# Patient Record
Sex: Female | Born: 1990 | Race: Black or African American | Hispanic: No | Marital: Single | Smoking: Never smoker
Health system: Southern US, Community
[De-identification: ages and names within clinical notes are randomized; demographics above are authoritative.]

## PROBLEM LIST (undated history)

## (undated) DIAGNOSIS — M4800 Spinal stenosis, site unspecified: Secondary | ICD-10-CM

## (undated) DIAGNOSIS — M5136 Other intervertebral disc degeneration, lumbar region: Secondary | ICD-10-CM

## (undated) HISTORY — PX: DENTAL SURGERY: SHX609

---

## 2016-12-09 ENCOUNTER — Encounter (HOSPITAL_COMMUNITY): Payer: Self-pay | Admitting: Emergency Medicine

## 2016-12-09 ENCOUNTER — Emergency Department (HOSPITAL_COMMUNITY)
Admission: EM | Admit: 2016-12-09 | Discharge: 2016-12-09 | Disposition: A | Discharge status: Other Acute Inpatient Hospital | Payer: Medicaid Other | Attending: Emergency Medicine | Admitting: Emergency Medicine

## 2016-12-09 ENCOUNTER — Other Ambulatory Visit: Payer: Self-pay

## 2016-12-09 DIAGNOSIS — R102 Pelvic and perineal pain: Secondary | ICD-10-CM

## 2016-12-09 DIAGNOSIS — O4700 False labor before 37 completed weeks of gestation, unspecified trimester: Secondary | ICD-10-CM

## 2016-12-09 DIAGNOSIS — O479 False labor, unspecified: Secondary | ICD-10-CM | POA: Diagnosis not present

## 2016-12-09 DIAGNOSIS — Z91040 Latex allergy status: Secondary | ICD-10-CM | POA: Diagnosis not present

## 2016-12-09 DIAGNOSIS — Z79899 Other long term (current) drug therapy: Secondary | ICD-10-CM | POA: Insufficient documentation

## 2016-12-09 DIAGNOSIS — Z3A28 28 weeks gestation of pregnancy: Secondary | ICD-10-CM | POA: Diagnosis not present

## 2016-12-09 DIAGNOSIS — O9989 Other specified diseases and conditions complicating pregnancy, childbirth and the puerperium: Secondary | ICD-10-CM | POA: Diagnosis not present

## 2016-12-09 HISTORY — DX: Spinal stenosis, site unspecified: M48.00

## 2016-12-09 HISTORY — DX: Other intervertebral disc degeneration, lumbar region: M51.36

## 2016-12-09 LAB — URINALYSIS, ROUTINE W REFLEX MICROSCOPIC
BILIRUBIN URINE: NEGATIVE
GLUCOSE, UA: NEGATIVE mg/dL
Hgb urine dipstick: NEGATIVE
KETONES UR: NEGATIVE mg/dL
Leukocytes, UA: NEGATIVE
Nitrite: NEGATIVE
PH: 5 (ref 5.0–8.0)
Protein, ur: NEGATIVE mg/dL
Specific Gravity, Urine: 1.021 (ref 1.005–1.030)

## 2016-12-09 MED ORDER — SODIUM CHLORIDE 0.9 % IV BOLUS (SEPSIS)
1000.0000 mL | Freq: Once | INTRAVENOUS | Status: AC
  Administered 2016-12-09: 1000 mL via INTRAVENOUS

## 2016-12-09 NOTE — Progress Notes (Signed)
[facility] for [facility] care.

## 2016-12-09 NOTE — ED Provider Notes (Signed)
[facility] EMERGENCY DEPARTMENT Provider Note   CSN: [id number] Arrival date & time: 12/09/16  1505     History   Chief Complaint Chief Complaint  Patient presents with  . Abdominal Pain    [redacted] weeks pregnant    HPI Delice BisonSharon Fogarty is a 26 y.o. female.  HPI  Pt was seen at 1635. Per pt, c/o gradual onset and persistence of multiple intermittent episodes of "contractions" that began 2 to 3 days ago. Has been associated with acute flair of her chronic LBP as well as "pressure" in her pelvis. Pt states she was evaluated by her [facility]N [facility] and was told her cervix was "fingertip." Pt states she has been having her female significant other "check her cervix" and "he said he could get 2 fingers in my cervix now." Pt denies vaginal bleeding, no dysuria/hematuria, no CP/SOB. Hx G4P3, EDD 03/03/17 with EGA [redacted] weeks.    Past Medical History:  Diagnosis Date  . Degeneration of L4-L5 intervertebral disc   . Spinal stenosis     There are no active problems to display for this patient.   Past Surgical History:  Procedure Laterality Date  . CESAREAN SECTION    . DENTAL SURGERY      OB History    Gravida Para Term Preterm AB Living   5         3   SAB TAB Ectopic Multiple Live Births                   Home Medications    Prior to Admission medications   Medication Sig Start Date End Date Taking? Authorizing Provider  acetaminophen (TYLENOL) 500 MG tablet Take 1,000 mg by mouth every 6 (six) hours as needed for mild pain or moderate pain.   Yes [provider]  albuterol (PROVENTIL HFA;VENTOLIN HFA) 108 (90 Base) MCG/ACT inhaler Inhale 1-2 puffs into the lungs every 6 (six) hours as needed for wheezing or shortness of breath.   Yes [provider]  fluticasone (FLONASE) 50 MCG/ACT nasal spray Place 2 sprays into both nostrils daily as needed for allergies or rhinitis.   Yes [provider]    Family History History reviewed. No  pertinent family history.  Social History Social History   Tobacco Use  . Smoking status: Never Smoker  . Smokeless tobacco: Never Used  Substance Use Topics  . Alcohol use: No    Frequency: Never  . Drug use: No     Allergies   Latex   Review of Systems Review of Systems ROS: Statement: All systems negative except as marked or noted in the HPI; Constitutional: Negative for fever and chills. ; ; Eyes: Negative for eye pain, redness and discharge. ; ; ENMT: Negative for ear pain, hoarseness, nasal congestion, sinus pressure and sore throat. ; ; Cardiovascular: Negative for chest pain, palpitations, diaphoresis, dyspnea and peripheral edema. ; ; Respiratory: Negative for cough, wheezing and stridor. ; ; Gastrointestinal: Negative for nausea, vomiting, diarrhea, abdominal pain, blood in stool, hematemesis, jaundice and rectal bleeding. . ; ; Genitourinary: Negative for dysuria, flank pain and hematuria. ; ; GYN:  +contractions, +pelvic pain, no vaginal bleeding, no vaginal discharge, no vulvar pain. ;; Musculoskeletal: +LBP. Negative for neck pain. Negative for swelling and trauma.; ; Skin: Negative for pruritus, rash, abrasions, blisters, bruising and skin lesion.; ; Neuro: Negative for headache, lightheadedness and neck stiffness. Negative for weakness, altered level of consciousness, altered mental status,  extremity weakness, paresthesias, involuntary movement, seizure and syncope.       Physical Exam Updated Vital Signs BP 114/65   Pulse 75   Temp 98.4 F (36.9 C) (Oral)   Resp 16   Ht 5\' 4"  (1.626 m)   Wt 69.9 kg (154 lb)   SpO2 100%   BMI 26.43 kg/m   Physical Exam 1640: Physical examination:  Nursing notes reviewed; Vital signs and O2 SAT reviewed;  Constitutional: Well developed, Well nourished, Well hydrated, In no acute distress; Head:  Normocephalic, atraumatic; Eyes: EOMI, PERRL, No scleral icterus; ENMT: Mouth and pharynx normal, Mucous membranes moist; Neck:  Supple, Full range of motion, No lymphadenopathy; Cardiovascular: Regular rate and rhythm, No gallop; Respiratory: Breath sounds clear & equal bilaterally, No wheezes.  Speaking full sentences with ease, Normal respiratory effort/excursion; Chest: Nontender, Movement normal; Abdomen: Soft, Nontender, Nondistended, Normal bowel sounds. Gravid.; Genitourinary: No CVA tenderness. FHR 150's. SVE performed with female ED RN chaperone present: no vaginal bleeding, cervix 1 finger.;; Extremities: Pulses normal, No tenderness, No edema, No calf edema or asymmetry.; Neuro: AA&Ox3, Major CN grossly intact.  Speech clear. No gross focal motor or sensory deficits in extremities.; Skin: Color normal, Warm, Dry.   ED Treatments / Results  Labs (all labs ordered are listed, but only abnormal results are displayed) Labs Reviewed  URINALYSIS, ROUTINE W REFLEX MICROSCOPIC    EKG  EKG Interpretation None       Radiology   Procedures Procedures (including critical care time)  Medications Ordered in ED Medications  sodium chloride 0.9 % bolus 1,000 mL (1,000 mLs Intravenous New Bag/Given 12/09/16 1726)  sodium chloride 0.9 % bolus 1,000 mL (1,000 mLs Intravenous New Bag/Given 12/09/16 1726)     Initial Impression / Assessment and Plan / ED Course  I have reviewed the triage vital signs and the nursing notes.  Pertinent labs & imaging results that were available during my care of the patient were reviewed by me and considered in my medical decision making (see chart for details).  MDM Reviewed: previous chart, nursing note and vitals Interpretation: labs   Results for orders placed or performed during the hospital encounter of 12/09/16  Urinalysis, Routine w reflex microscopic  Result Value Ref Range   Color, Urine YELLOW YELLOW   APPearance CLEAR CLEAR   Specific Gravity, Urine 1.021 1.005 - 1.030   pH 5.0 5.0 - 8.0   Glucose, UA NEGATIVE NEGATIVE mg/dL   Hgb urine dipstick NEGATIVE NEGATIVE    Bilirubin Urine NEGATIVE NEGATIVE   Ketones, ur NEGATIVE NEGATIVE mg/dL   Protein, ur NEGATIVE NEGATIVE mg/dL   Nitrite NEGATIVE NEGATIVE   Leukocytes, UA NEGATIVE NEGATIVE    1705:  Pt states she is "having contractions" since arrival to the ED. FHR 150's. No vaginal bleeding. Concern regarding HPI above with female significant other checking cervix and possible infection.  [facility] Dr. Vonda Antiguaeiff, case discussed, including:  HPI, pertinent PM/SHx, VS/PE, dx testing, ED course and treatment:  Agreeable to accept transfer to 5700.      Final Clinical Impressions(s) / ED Diagnoses   Final diagnoses:  None    ED Discharge Orders    None       Samuel JesterMcManus, Latha Staunton, DO 12/12/16 [id number]

## 2016-12-09 NOTE — ED Notes (Signed)
Carollee HerterShannon, OB Rapid Response RN notified at this time.

## 2016-12-09 NOTE — Progress Notes (Addendum)
G4P3 at 28 weeks reports to [facility] with c/o cxns x2 days.  No bleeding or leaking reported to [facility].  [facility] for high risk pregnancy due to hx: spinal stenosis.  Talked with Aundra MilletMegan RN at [facility].  [facility] MD performed SVE and states she is FT.

## 2016-12-09 NOTE — ED Triage Notes (Signed)
PT states she is [redacted] weeks pregnant and started having some painful contractions x2 days ago. PT states her OBGYN told her to get checked out to make sure she is not dilated.

## 2016-12-09 NOTE — Progress Notes (Signed)
Discussed pt status with Dr Jolayne Pantheronstant.  [facility] not [facility] patients due to high census and acuity.   [facility] to determine future [facility].  [facility] states the pt has already contacted [facility] about her current complaints and status.  They have accepted her care if she decides to leave [facility].

## 2017-06-27 ENCOUNTER — Other Ambulatory Visit: Payer: Self-pay

## 2017-06-27 ENCOUNTER — Encounter (HOSPITAL_COMMUNITY): Payer: Self-pay | Admitting: *Deleted

## 2017-06-27 ENCOUNTER — Emergency Department (HOSPITAL_COMMUNITY)
Admission: EM | Admit: 2017-06-27 | Discharge: 2017-06-28 | Disposition: A | Discharge status: Home / Self Care | Payer: Medicaid Other | Attending: Emergency Medicine | Admitting: Emergency Medicine

## 2017-06-27 DIAGNOSIS — R079 Chest pain, unspecified: Secondary | ICD-10-CM | POA: Diagnosis present

## 2017-06-27 DIAGNOSIS — M791 Myalgia, unspecified site: Secondary | ICD-10-CM | POA: Diagnosis not present

## 2017-06-27 DIAGNOSIS — J029 Acute pharyngitis, unspecified: Secondary | ICD-10-CM | POA: Insufficient documentation

## 2017-06-27 NOTE — ED Notes (Signed)
[facility]

## 2017-06-27 NOTE — ED Triage Notes (Signed)
Pt c/o sore throat, throat swelling, unable to speak, chest pain that started tonight,

## 2017-06-28 ENCOUNTER — Emergency Department (HOSPITAL_COMMUNITY): Payer: Medicaid Other

## 2017-06-28 LAB — GROUP A STREP BY PCR: Group A Strep by PCR: NOT DETECTED

## 2017-06-28 MED ORDER — ONDANSETRON 4 MG PO TBDP
4.0000 mg | ORAL_TABLET | Freq: Once | ORAL | Status: AC
  Administered 2017-06-28: 4 mg via ORAL
  Filled 2017-06-28: qty 1

## 2017-06-28 MED ORDER — HYDROMORPHONE HCL 1 MG/ML IJ SOLN
0.5000 mg | Freq: Once | INTRAMUSCULAR | Status: AC
  Administered 2017-06-28: 0.5 mg via INTRAMUSCULAR
  Filled 2017-06-28: qty 1

## 2017-06-28 NOTE — Discharge Instructions (Addendum)
Your vital signs are within normal limits.  Your strep test is negative.  Your CT scan shows no mass, or abscess or related problems within the neck, the sinuses, or the voicebox area.  I suspect that you have a viral pharyngitis/sore throat.  Please wash hands frequently.  Please use Tylenol every 4 hours, or ibuprofen every 6 hours.  Salt water gargles will be helpful.  Chloraseptic spray will also be helpful.  Please do not allow anyone to share your eating utensils.  Keep your distance from others until this has resolved.  Please see your Medicaid access physician or return to the emergency department if any changes in your condition, problems, or concerns.

## 2017-06-28 NOTE — ED Provider Notes (Signed)
[facility] EMERGENCY DEPARTMENT Provider Note   CSN: [id number] Arrival date & time: 06/27/17  2325     History   Chief Complaint Chief Complaint  Patient presents with  . Chest Pain    HPI Kathleen Lecy is a 27 y.o. female.  Patient is a 27 year old female who presents to the emergency department with a complaint of sore throat, loss of voice, chest pain, and back pain.  The patient states that it hurts too much to swallow and it hurts too much to speak  .  The patient history is obtained through the spouse, and the patient is communicating with the spouse at times through the cell phone text.  This problem started on yesterday.  The patient now has throat pain, sensation that she has swelling in her throat.  She states that she is unable to speak because it hurts too bad.  She also says that she has pain from the throat area into the chest and then into the back.  She has not had any high fever to be reported.  She has not had any injury or trauma to the throat area.  No trauma to the chest area.  She has not had any recent operations or procedures.  She has not had any loss of consciousness or unusual shortness of breath.  She presents to the emergency department because she cannot take the pain anymore.  The history is provided by the spouse.  Chest Pain   Pertinent negatives include no abdominal pain, no back pain, no cough, no dizziness, no fever, no palpitations and no shortness of breath.  Pertinent negatives for past medical history include no seizures.    Past Medical History:  Diagnosis Date  . Degeneration of L4-L5 intervertebral disc   . Spinal stenosis     There are no active problems to display for this patient.   Past Surgical History:  Procedure Laterality Date  . CESAREAN SECTION    . DENTAL SURGERY       OB History    Gravida  5   Para      Term      Preterm      AB      Living  3     SAB      TAB      Ectopic      Multiple      Live Births               Home Medications    Prior to Admission medications   Medication Sig Start Date End Date Taking? Authorizing Provider  acetaminophen (TYLENOL) 500 MG tablet Take 1,000 mg by mouth every 6 (six) hours as needed for mild pain or moderate pain.    [provider]  albuterol (PROVENTIL HFA;VENTOLIN HFA) 108 (90 Base) MCG/ACT inhaler Inhale 1-2 puffs into the lungs every 6 (six) hours as needed for wheezing or shortness of breath.    [provider]  fluticasone (FLONASE) 50 MCG/ACT nasal spray Place 2 sprays into both nostrils daily as needed for allergies or rhinitis.    [provider]    Family History No family history on file.  Social History Social History   Tobacco Use  . Smoking status: Never Smoker  . Smokeless tobacco: Never Used  Substance Use Topics  . Alcohol use: No    Frequency: Never  . Drug use: No     Allergies   Latex   Review of Systems Review  of Systems  Constitutional: Positive for activity change and appetite change. Negative for fever.       All ROS Neg except as noted in HPI  HENT: Positive for sore throat, trouble swallowing and voice change. Negative for nosebleeds.   Eyes: Negative for photophobia and discharge.  Respiratory: Negative for cough, shortness of breath and wheezing.   Cardiovascular: Positive for chest pain. Negative for palpitations.  Gastrointestinal: Negative for abdominal pain and blood in stool.  Genitourinary: Negative for dysuria, frequency and hematuria.  Musculoskeletal: Negative for arthralgias, back pain and neck pain.  Skin: Negative.   Neurological: Negative for dizziness, seizures and speech difficulty.  Psychiatric/Behavioral: Negative for confusion and hallucinations.     Physical Exam Updated Vital Signs BP 132/79   Pulse (!) 56   Temp 98.2 F (36.8 C) (Oral)   Resp 19   Ht 5\' 4"  (1.626 m)   Wt 74.8 kg (165 lb)   SpO2 100%   Breastfeeding? Unknown    BMI 28.32 kg/m   Physical Exam  Constitutional: She is oriented to person, place, and time. She appears well-developed and well-nourished.  Non-toxic appearance.  HENT:  Head: Normocephalic.  Right Ear: Tympanic membrane and external ear normal.  Left Ear: Tympanic membrane and external ear normal.  Few palpable small cervical lymph nodes appreciated. Mild increased redness of the posterior pharynx.  The uvula is in the midline.  The airway is patent.  No exudates seen.  Eyes: Pupils are equal, round, and reactive to light. EOM and lids are normal.  Neck: Normal range of motion. Neck supple. Carotid bruit is not present. No tracheal deviation present.  Cardiovascular: Normal rate, regular rhythm, normal heart sounds, intact distal pulses and normal pulses. Exam reveals no gallop and no friction rub.  No murmur heard. Pulmonary/Chest: Effort normal and breath sounds normal. No respiratory distress. She has no wheezes.  Abdominal: Soft. Bowel sounds are normal. There is no tenderness. There is no guarding.  Musculoskeletal: Normal range of motion.  Lymphadenopathy:       Head (right side): No submandibular adenopathy present.       Head (left side): No submandibular adenopathy present.    She has cervical adenopathy.  Neurological: She is alert and oriented to person, place, and time. She has normal strength. No cranial nerve deficit or sensory deficit. Coordination normal.  Skin: Skin is warm and dry. No rash noted.  Psychiatric: She has a normal mood and affect. Her speech is normal.  Nursing note and vitals reviewed.    ED Treatments / Results  Labs (all labs ordered are listed, but only abnormal results are displayed) Labs Reviewed  GROUP A STREP BY PCR    EKG EKG Interpretation  Date/Time:  Saturday June 27 2017 23:49:35 EDT Ventricular Rate:  67 PR Interval:    QRS Duration: 96 QT Interval:  439 QTC Calculation: 464 R Axis:   51 Text Interpretation:  Sinus rhythm  Low voltage, precordial leads Borderline T abnormalities, diffuse leads Baseline wander in lead(s) II No old tracing to compare Confirmed by Devoria Albe ([id number]) on 06/28/2017 12:09:39 AM   Radiology Ct Soft Tissue Neck Wo Contrast  Result Date: 06/28/2017 CLINICAL DATA:  Initial evaluation for acute sore throat, throat swelling. EXAM: CT NECK WITHOUT CONTRAST TECHNIQUE: Multidetector CT imaging of the neck was performed following the standard protocol without intravenous contrast. COMPARISON:  None. FINDINGS: Pharynx and larynx: Oral cavity within normal limits without mass lesion or loculated collection. No acute inflammatory  changes about the dentition. Oropharynx and nasopharynx within normal limits. No retropharyngeal collection or effusion. Epiglottis within normal limits. Vallecula largely clear. Remainder of the hypopharynx and supraglottic larynx within normal limits. True cords grossly symmetric and within normal limits, although evaluation limited by motion. Subglottic airway clear. Salivary glands: Salivary glands including the parotid and submandibular glands are normal. Thyroid: Thyroid within normal limits. Lymph nodes: Shotty subcentimeter lymph nodes seen throughout the neck bilaterally. No pathologically enlarged lymph nodes identified. Vascular: No significant atherosclerotic change within the neck. Limited intracranial: Unremarkable. Visualized orbits: Visualized globes and orbital soft tissues within normal limits. Mastoids and visualized paranasal sinuses: Minimal mucosal thickening within the right posterior ethmoidal air cells. Visualized paranasal sinuses are otherwise clear. Mastoid air cells and middle ear cavities are well pneumatized and free of fluid. Skeleton: No acute osseous abnormality. No worrisome lytic or blastic osseous lesions. Upper chest: Partially visualized upper chest within normal limits. Partially visualized lungs are grossly clear. Other: None. IMPRESSION: Negative  noncontrast CT of the neck. No acute inflammatory changes or other abnormality identified. Oropharyngeal airway widely patent. Electronically Signed   By: Rise Mu M.D.   On: 06/28/2017 01:29    Procedures Procedures (including critical care time)  Medications Ordered in ED Medications  HYDROmorphone (DILAUDID) injection 0.5 mg (0.5 mg Intramuscular Given 06/28/17 0042)  ondansetron (ZOFRAN-ODT) disintegrating tablet 4 mg (4 mg Oral Given 06/28/17 0041)     Initial Impression / Assessment and Plan / ED Course  I have reviewed the triage vital signs and the nursing notes.  Pertinent labs & imaging results that were available during my care of the patient were reviewed by me and considered in my medical decision making (see chart for details).       Final Clinical Impressions(s) / ED Diagnoses MDM  Vital signs within normal limits.  Patient states the pain is severe to swallow and she cannot speak because it hurts.  No stridor appreciated on auscultation over the tracheal area and the trachea is midline.  Patient given intramuscular Dilaudid and Zofran ODT.  Strep test is negative, vital signs are within normal limits, doubt strep related infection.  Electrocardiogram shows a normal sinus rhythm.  There is no STEMI, And no life-threatening arrhythmias.   CT scan of the soft tissues of the neck show the pharynx, larynx, oral cavity all within normal limits.  There is no mass appreciated.  The epiglottis is within normal limits, doubt epiglottitis or related problem.  There are few shotty lymph nodes seen throughout the neck bilaterally.  No worrisome or lytic lesions noted within the bony structures.  In the partially visualized upper chest is within normal limits.  Chest x-ray-negative for active cardiopulmonary disease.  Recheck.  The patient is beginning to speak a little bit now she says that the shot seems to be helping her pain and discomfort.  I have discussed the  CT scan, the strips test, and the chest x-ray with the patient and her spouse in terms of which they understand.  Questions were answered.  I have asked the patient to use salt water gargles and Chloraseptic spray for pain and discomfort.  To use Tylenol every 4 hours, or ibuprofen every 6 hours.  To wash hands frequently.  I provided a mask for them.  I have asked him to follow-up with a primary physician.  Or to return to the emergency department if any changes in condition, problems, or concerns.  Family is in agreement with this plan.  Final diagnoses:  Pharyngitis, unspecified etiology  Myalgia    ED Discharge Orders    None       Ivery QualeBryant, Toye Rouillard, PA-C 06/28/17 1649    Devoria AlbeKnapp, Iva, MD 06/28/17 2258

## 2017-11-03 ENCOUNTER — Encounter (HOSPITAL_COMMUNITY): Payer: Self-pay

## 2017-11-03 ENCOUNTER — Emergency Department (HOSPITAL_COMMUNITY): Payer: Medicaid Other

## 2017-11-03 ENCOUNTER — Other Ambulatory Visit: Payer: Self-pay

## 2017-11-03 ENCOUNTER — Emergency Department (HOSPITAL_COMMUNITY)
Admission: EM | Admit: 2017-11-03 | Discharge: 2017-11-03 | Disposition: A | Discharge status: Home / Self Care | Payer: Medicaid Other | Attending: Emergency Medicine | Admitting: Emergency Medicine

## 2017-11-03 DIAGNOSIS — J40 Bronchitis, not specified as acute or chronic: Secondary | ICD-10-CM | POA: Insufficient documentation

## 2017-11-03 DIAGNOSIS — R05 Cough: Secondary | ICD-10-CM | POA: Diagnosis present

## 2017-11-03 LAB — INFLUENZA PANEL BY PCR (TYPE A & B)
INFLBPCR: NEGATIVE
Influenza A By PCR: NEGATIVE

## 2017-11-03 MED ORDER — AMOXICILLIN 500 MG PO CAPS: 500.0000 mg | ORAL_CAPSULE | Freq: Three times a day (TID) | ORAL | 0 refills | Status: AC

## 2017-11-03 NOTE — ED Provider Notes (Signed)
[facility] EMERGENCY DEPARTMENT Provider Note   CSN: [id number] Arrival date & time: 11/03/17  4034     History   Chief Complaint Chief Complaint  Patient presents with  . Cough    HPI Kathleen Frank is a 27 y.o. female.  Patient complains of cough and congestion and aches for a week now.  Minimal sputum production  The history is provided by the patient. No language interpreter was used.  Cough  This is a new problem. The current episode started more than 2 days ago. The problem occurs constantly. The problem has not changed since onset.The cough is non-productive. There has been no fever (No fever). Pertinent negatives include no chest pain and no headaches. The treatment provided no relief.    Past Medical History:  Diagnosis Date  . Degeneration of L4-L5 intervertebral disc   . Spinal stenosis     There are no active problems to display for this patient.   Past Surgical History:  Procedure Laterality Date  . CESAREAN SECTION    . DENTAL SURGERY       OB History    Gravida  6   Para      Term      Preterm      AB      Living  3     SAB      TAB      Ectopic      Multiple      Live Births               Home Medications    Prior to Admission medications   Medication Sig Start Date End Date Taking? Authorizing Provider  acetaminophen (TYLENOL) 500 MG tablet Take 1,000 mg by mouth every 6 (six) hours as needed for mild pain or moderate pain.    [provider]  albuterol (PROVENTIL HFA;VENTOLIN HFA) 108 (90 Base) MCG/ACT inhaler Inhale 1-2 puffs into the lungs every 6 (six) hours as needed for wheezing or shortness of breath.    [provider]  amoxicillin (AMOXIL) 500 MG capsule Take 1 capsule (500 mg total) by mouth 3 (three) times daily. 11/03/17   Bethann Berkshire, MD  fluticasone (FLONASE) 50 MCG/ACT nasal spray Place 2 sprays into both nostrils daily as needed for allergies or rhinitis.    [provider]      Family History No family history on file.  Social History Social History   Tobacco Use  . Smoking status: Never Smoker  . Smokeless tobacco: Never Used  Substance Use Topics  . Alcohol use: No    Frequency: Never  . Drug use: No     Allergies   Latex   Review of Systems Review of Systems  Constitutional: Negative for appetite change and fatigue.  HENT: Negative for congestion, ear discharge and sinus pressure.   Eyes: Negative for discharge.  Respiratory: Positive for cough.   Cardiovascular: Negative for chest pain.  Gastrointestinal: Negative for abdominal pain and diarrhea.  Genitourinary: Negative for frequency and hematuria.  Musculoskeletal: Negative for back pain.  Skin: Negative for rash.  Neurological: Negative for seizures and headaches.  Psychiatric/Behavioral: Negative for hallucinations.     Physical Exam Updated Vital Signs BP 117/72 (BP Location: Right Arm)   Pulse (!) 103   Temp 98.7 F (37.1 C) (Oral)   Resp 18   Ht 5\' 4"  (1.626 m)   Wt 78 kg   SpO2 98%   BMI 29.52 kg/m   Physical  Exam  Constitutional: She is oriented to person, place, and time. She appears well-developed.  HENT:  Head: Normocephalic.  Eyes: Conjunctivae and EOM are normal. No scleral icterus.  Neck: Neck supple. No thyromegaly present.  Cardiovascular: Normal rate and regular rhythm. Exam reveals no gallop and no friction rub.  No murmur heard. Pulmonary/Chest: No stridor. She has no wheezes. She has no rales. She exhibits no tenderness.  Abdominal: She exhibits no distension. There is no tenderness. There is no rebound.  Musculoskeletal: Normal range of motion. She exhibits no edema.  Lymphadenopathy:    She has no cervical adenopathy.  Neurological: She is oriented to person, place, and time. She exhibits normal muscle tone. Coordination normal.  Skin: No rash noted. No erythema.  Psychiatric: She has a normal mood and affect. Her behavior is normal.     ED  Treatments / Results  Labs (all labs ordered are listed, but only abnormal results are displayed) Labs Reviewed  INFLUENZA PANEL BY PCR (TYPE A & B)    EKG None  Radiology No results found.  Procedures Procedures (including critical care time)  Medications Ordered in ED Medications - No data to display   Initial Impression / Assessment and Plan / ED Course  I have reviewed the triage vital signs and the nursing notes.  Pertinent labs & imaging results that were available during my care of the patient were reviewed by me and considered in my medical decision making (see chart for details).     Patient with cough congestion persistent for over a week.  Flu test negative.  Patient will be covered with amoxicillin for respiratory infection  Final Clinical Impressions(s) / ED Diagnoses   Final diagnoses:  Bronchitis    ED Discharge Orders         Ordered    amoxicillin (AMOXIL) 500 MG capsule  3 times daily     11/03/17 1346           Bethann Berkshire, MD 11/03/17 1350

## 2017-11-03 NOTE — ED Triage Notes (Signed)
Pt is approximately [redacted] weeks pregnant. Sees OB in [place]. Is having coughing, congestion, and losing voice. Has tried OTC medications with no relief.

## 2017-11-03 NOTE — ED Notes (Signed)
Pt declined having chest xray due to being pregnant

## 2017-11-03 NOTE — Discharge Instructions (Addendum)
Tylenol for fever.  Drink plenty of fluids.  Follow-up with your doctor if not improving

## 2019-04-28 IMAGING — DX DG CHEST 2V
2 series · 2 of 2 positions shown · non-contrast
Comparison: None.

CLINICAL DATA: sore throat, throat swelling, unable to speak, chest
pain that started tonight. Patient was unable to elaborate on pain
or symptoms.

EXAM:
CHEST - 2 VIEW

[chest pa]
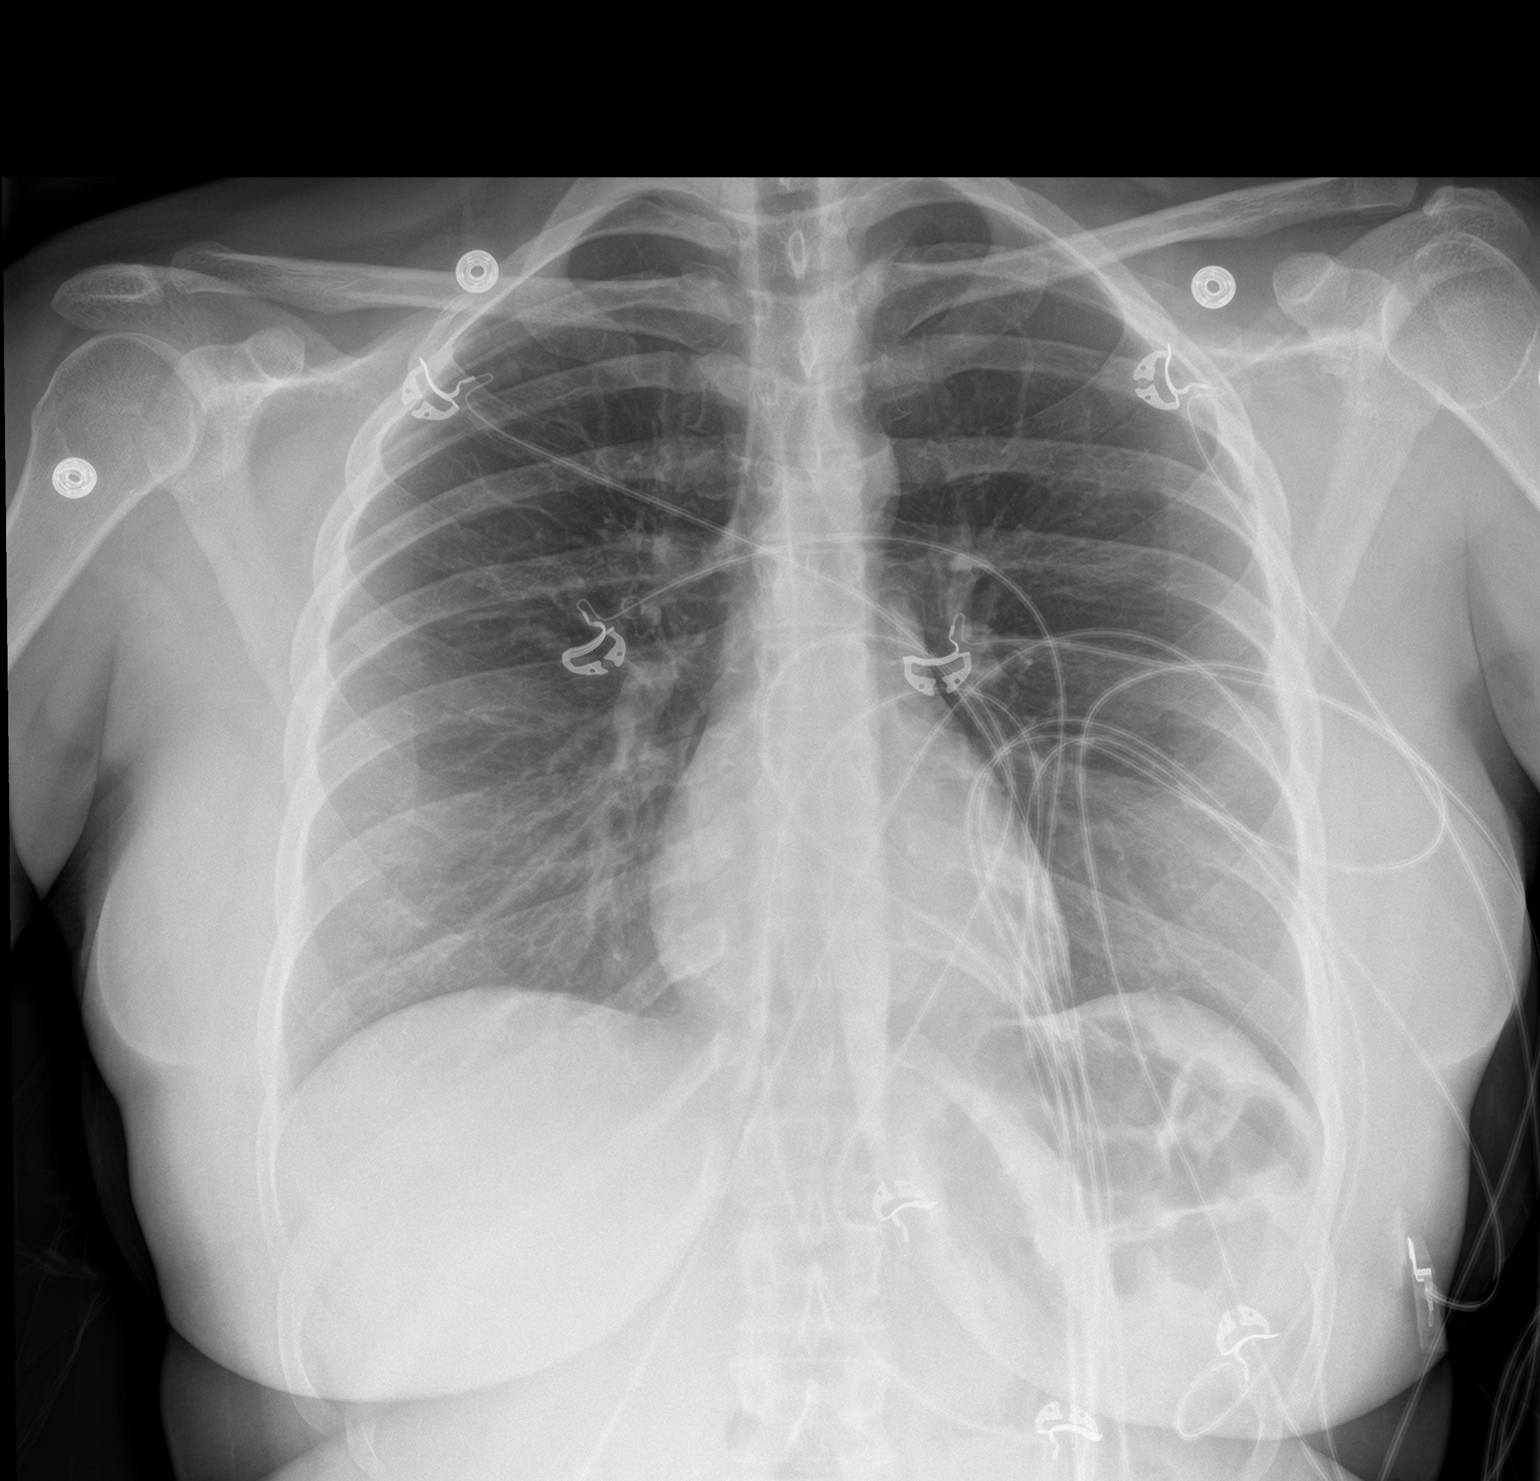

[chest lat]
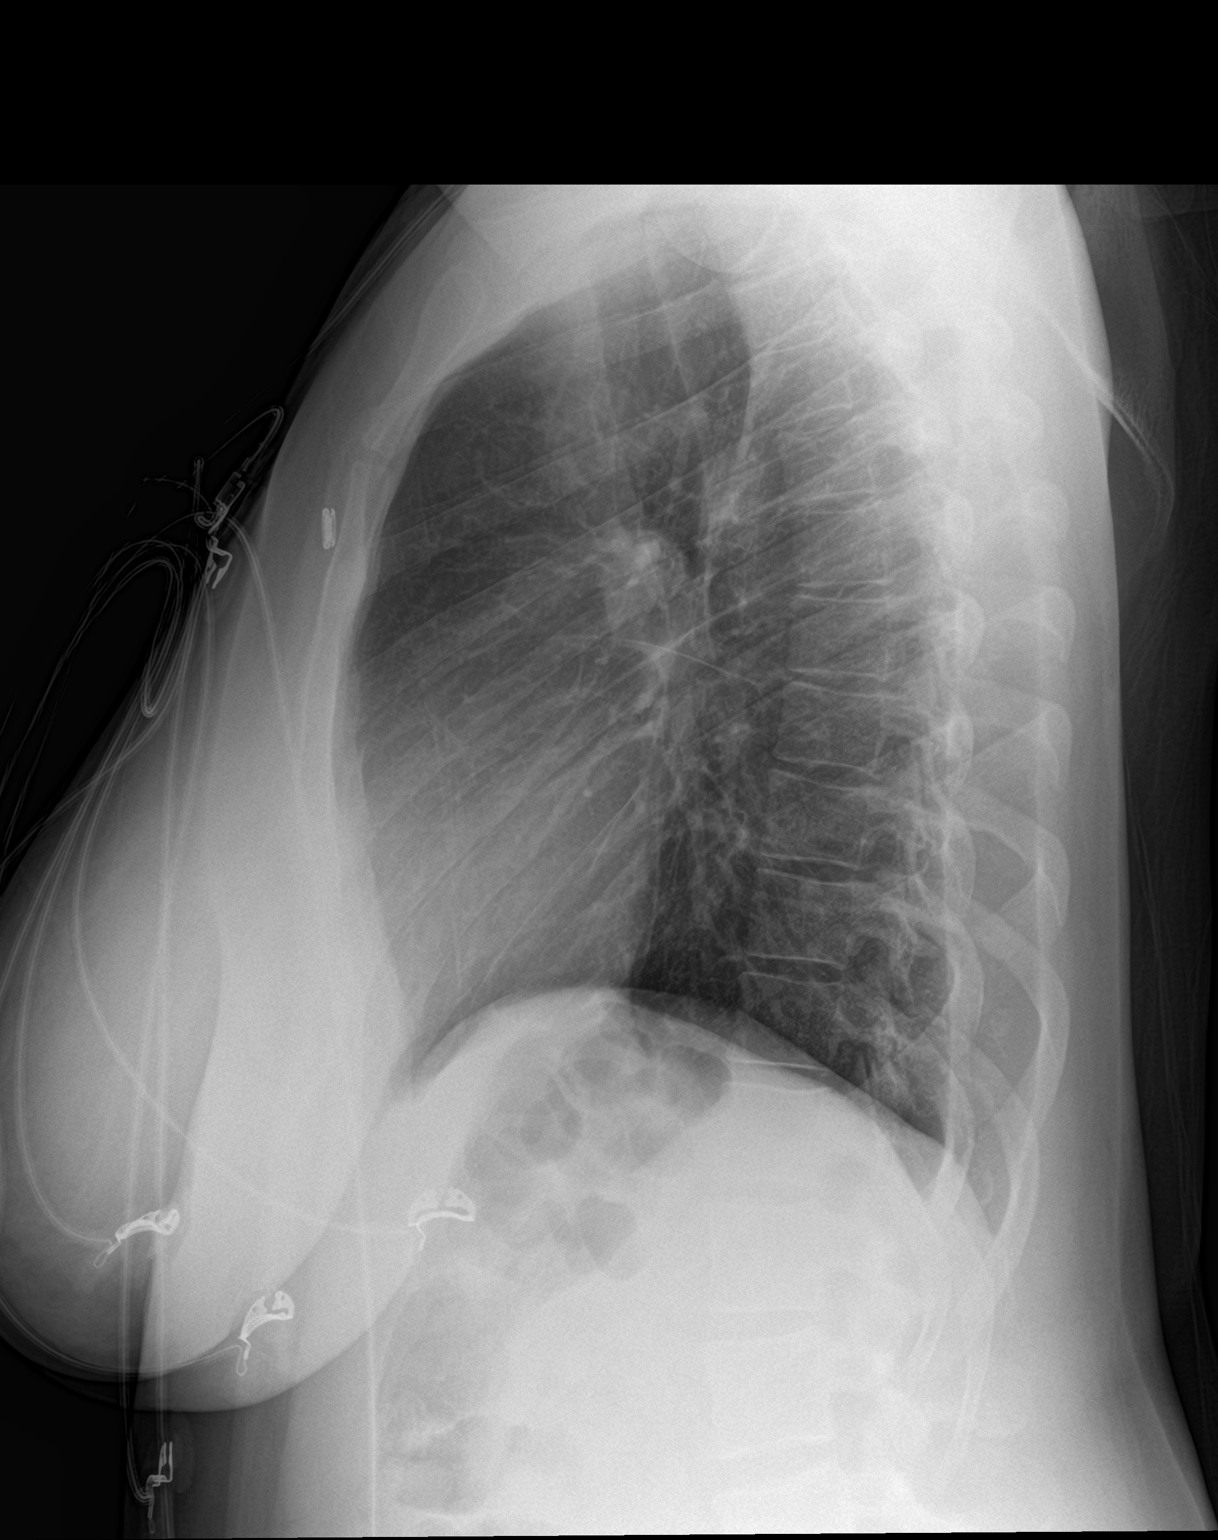

[2 of 2 positions shown; findings below may reference images not displayed]

FINDINGS: The heart size and mediastinal contours are within normal limits.
Both lungs are clear. The visualized skeletal structures are
unremarkable.
IMPRESSION: No active cardiopulmonary disease.
